# Patient Record
Sex: Male | Born: 2000 | Hispanic: Yes | Marital: Single | Smoking: Never smoker
Health system: Southern US, Community
[De-identification: ages and names within clinical notes are randomized; demographics above are authoritative.]

## PROBLEM LIST (undated history)

## (undated) ENCOUNTER — Emergency Department (HOSPITAL_COMMUNITY): Disposition: A | Discharge status: Home / Self Care | Payer: Self-pay

## (undated) DIAGNOSIS — N390 Urinary tract infection, site not specified: Secondary | ICD-10-CM

## (undated) DIAGNOSIS — Z8619 Personal history of other infectious and parasitic diseases: Secondary | ICD-10-CM

## (undated) DIAGNOSIS — Q625 Duplication of ureter: Secondary | ICD-10-CM

## (undated) DIAGNOSIS — N1370 Vesicoureteral-reflux, unspecified: Secondary | ICD-10-CM

## (undated) HISTORY — PX: BLADDER REPAIR: SHX76

## (undated) HISTORY — DX: Duplication of ureter: Q62.5

## (undated) HISTORY — DX: Urinary tract infection, site not specified: N39.0

## (undated) HISTORY — DX: Personal history of other infectious and parasitic diseases: Z86.19

## (undated) HISTORY — DX: Vesicoureteral-reflux, unspecified: N13.70

---
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
## 2021-09-16 ENCOUNTER — Other Ambulatory Visit: Payer: Self-pay

## 2021-09-16 ENCOUNTER — Ambulatory Visit (INDEPENDENT_AMBULATORY_CARE_PROVIDER_SITE_OTHER): Payer: 59 | Admitting: Physician Assistant

## 2021-09-16 VITALS — BP 100/66 | HR 69 | Temp 98.1°F | Ht 68.25 in | Wt 116.2 lb

## 2021-09-16 DIAGNOSIS — R0989 Other specified symptoms and signs involving the circulatory and respiratory systems: Secondary | ICD-10-CM

## 2021-09-16 DIAGNOSIS — Z13220 Encounter for screening for lipoid disorders: Secondary | ICD-10-CM | POA: Diagnosis not present

## 2021-09-16 DIAGNOSIS — Z1329 Encounter for screening for other suspected endocrine disorder: Secondary | ICD-10-CM

## 2021-09-16 DIAGNOSIS — Z131 Encounter for screening for diabetes mellitus: Secondary | ICD-10-CM | POA: Diagnosis not present

## 2021-09-16 LAB — CBC WITH DIFFERENTIAL/PLATELET
Basophils Absolute: 0 10*3/uL (ref 0.0–0.1)
Basophils Relative: 0.8 % (ref 0.0–3.0)
Eosinophils Absolute: 0.1 10*3/uL (ref 0.0–0.7)
Eosinophils Relative: 2.2 % (ref 0.0–5.0)
HCT: 43.2 % (ref 39.0–52.0)
Hemoglobin: 14.9 g/dL (ref 13.0–17.0)
Lymphocytes Relative: 39.2 % (ref 12.0–46.0)
Lymphs Abs: 1.6 10*3/uL (ref 0.7–4.0)
MCHC: 34.4 g/dL (ref 30.0–36.0)
MCV: 89 fl (ref 78.0–100.0)
Monocytes Absolute: 0.3 10*3/uL (ref 0.1–1.0)
Monocytes Relative: 6.4 % (ref 3.0–12.0)
Neutro Abs: 2.2 10*3/uL (ref 1.4–7.7)
Neutrophils Relative %: 51.4 % (ref 43.0–77.0)
Platelets: 263 10*3/uL (ref 150.0–400.0)
RBC: 4.86 Mil/uL (ref 4.22–5.81)
RDW: 13.1 % (ref 11.5–14.6)
WBC: 4.2 10*3/uL — ABNORMAL LOW (ref 4.5–10.5)

## 2021-09-16 LAB — THYROID PANEL WITH TSH
Free Thyroxine Index: 2.7 (ref 1.4–3.8)
T3 Uptake: 32 % (ref 22–35)
T4, Total: 8.3 ug/dL (ref 5.1–10.3)
TSH: 1.35 mIU/L (ref 0.40–4.50)

## 2021-09-16 NOTE — Progress Notes (Signed)
Subjective:    Patient ID: Isaiah Moore, male    DOB: Apr 07, 2001, 21 y.o.   MRN: [id number]  Chief Complaint  Patient presents with   Throat Issues    HPI 21 y.o. patient presents today for new patient establishment with me.  Patient was previously established with Dr. Luna Fuse. 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 Living with parents & one of his three sisters.   Current Care Team: No specialists    Acute Concerns: Feeling fullness in his throat for the last year or so. Only hurts when he applies some pressure. No difficulty swallowing. No personal or family issues of thyroid conditions. He finds it difficult to gain weight and feels like he is underweight for the amount of food he takes in. Usually naps daily, but wouldn't say he is overwhelmingly fatigued. Usually runs 1-2 miles a few times per week for exercise. Tried lifting weights at the gym but did not see much progress for muscle building. No other symptoms.    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 Off prophylaxis. Discharged from follow-up with Urology.    Urinary tract infection     Past Surgical History:  Procedure Laterality Date   BLADDER REPAIR     correction of vesicoureteral reflux Right 07/21/13    Family History  Problem Relation Age of Onset   Hypertension Maternal Grandmother    Allergies Mother    Healthy Father     Social History   Tobacco Use   Smoking status: Never   Smokeless tobacco: Never  Vaping Use   Vaping Use: Never used  Substance Use Topics   Alcohol use: No   Drug use: No     No Known Allergies  Review of Systems NEGATIVE UNLESS OTHERWISE INDICATED IN HPI      Objective:     BP 100/66    Pulse 69    Temp 98.1 F (36.7 C)    Ht 5' 8.25" (1.734 m)    Wt 116 lb 3.2 oz (52.7 kg)    SpO2 99%    BMI 17.54 kg/m   Wt  Readings from Last 3 Encounters:  09/16/21 116 lb 3.2 oz (52.7 kg)  03/04/18 101 lb 6.4 oz (46 kg) (<1 %, Z= -2.60)*  10/07/17 101 lb 3.2 oz (45.9 kg) (<1 %, Z= -2.40)*   * Growth percentiles are based on CDC (Boys, 2-20 Years) data.    BP Readings from Last 3 Encounters:  09/16/21 100/66  09/06/19 116/75  09/05/19 115/72     Physical Exam Vitals and nursing note reviewed.  Constitutional:      General: He is not in acute distress.    Appearance: Normal appearance. He is not toxic-appearing.     Comments: THIN, but not wasting   HENT:     Head: Normocephalic and atraumatic.     Right Ear: Tympanic membrane, ear canal and external ear normal.     Left Ear: Tympanic membrane, ear canal and external ear normal.     Nose: Nose normal.     Mouth/Throat:     Mouth: Mucous membranes are moist.     Pharynx: Oropharynx is clear.  Eyes:     Extraocular Movements: Extraocular movements intact.     Conjunctiva/sclera: Conjunctivae normal.     Pupils: Pupils are equal, round, and reactive to light.  Neck:  Thyroid: No thyroid mass, thyromegaly or thyroid tenderness.  Cardiovascular:     Rate and Rhythm: Normal rate and regular rhythm.     Pulses: Normal pulses.     Heart sounds: Normal heart sounds.  Pulmonary:     Effort: Pulmonary effort is normal.     Breath sounds: Normal breath sounds.  Musculoskeletal:        General: Normal range of motion.     Cervical back: Normal range of motion and neck supple.  Lymphadenopathy:     Cervical: No cervical adenopathy.  Skin:    General: Skin is warm and dry.  Neurological:     General: No focal deficit present.     Mental Status: He is alert and oriented to person, place, and time.  Psychiatric:        Mood and Affect: Mood normal.        Behavior: Behavior normal.       Assessment & Plan:   Problem List Items Addressed This Visit   None Visit Diagnoses     Throat fullness    -  Primary   Relevant Orders   CBC with  Differential/Platelet   Comprehensive metabolic panel   Thyroid Panel With TSH   Diabetes mellitus screening       Relevant Orders   Comprehensive metabolic panel   Screening for cholesterol level       Relevant Orders   Lipid panel   Thyroid disorder screen       Relevant Orders   Thyroid Panel With TSH       Plan: -New patient establishment. 21 yo overall healthy male having trouble gaining weight and feeling some fullness along his throat. Will check labs, esp need to r/o thyroid disorder. Pending normal labs, will need to meet with nutritionist as next best step.   Keyante Durio M Mc Hollen, PA-C

## 2021-09-16 NOTE — Patient Instructions (Addendum)
Good to meet you today! Please go to the lab for blood work and I will send results through MyChart. We will treat pending results. May need to consider seeing a nutritionist as well.

## 2021-09-17 LAB — COMPREHENSIVE METABOLIC PANEL
ALT: 10 U/L (ref 0–53)
AST: 15 U/L (ref 0–37)
Albumin: 4.8 g/dL (ref 3.5–5.2)
Alkaline Phosphatase: 59 U/L (ref 39–117)
BUN: 12 mg/dL (ref 6–23)
CO2: 26 mEq/L (ref 19–32)
Calcium: 9.3 mg/dL (ref 8.4–10.5)
Chloride: 104 mEq/L (ref 96–112)
Creatinine, Ser: 0.9 mg/dL (ref 0.40–1.50)
GFR: 122.62 mL/min (ref >60.00)
Glucose, Bld: 85 mg/dL (ref 70–99)
Potassium: 4 mEq/L (ref 3.5–5.1)
Sodium: 141 mEq/L (ref 135–145)
Total Bilirubin: 0.7 mg/dL (ref 0.2–1.2)
Total Protein: 7.9 g/dL (ref 6.0–8.3)

## 2021-09-17 LAB — LIPID PANEL
Cholesterol: 157 mg/dL (ref 0–200)
HDL: 51.3 mg/dL (ref >39.00)
LDL Cholesterol: 98 mg/dL (ref 0–99)
NonHDL: 106.08
Total CHOL/HDL Ratio: 3
Triglycerides: 40 mg/dL (ref 0.0–149.0)
VLDL: 8 mg/dL (ref 0.0–40.0)

## 2022-04-27 ENCOUNTER — Encounter: Payer: Self-pay | Admitting: *Deleted

## 2022-07-16 ENCOUNTER — Encounter: Payer: Self-pay | Admitting: *Deleted

## 2024-08-14 ENCOUNTER — Ambulatory Visit: Admitting: Physician Assistant

## 2024-08-15 ENCOUNTER — Encounter: Payer: Self-pay | Admitting: Physician Assistant

## 2024-08-15 ENCOUNTER — Ambulatory Visit (INDEPENDENT_AMBULATORY_CARE_PROVIDER_SITE_OTHER): Admitting: Physician Assistant

## 2024-08-15 VITALS — BP 108/68 | HR 73 | Temp 98.2°F | Ht 68.25 in | Wt 137.2 lb

## 2024-08-15 DIAGNOSIS — Z8619 Personal history of other infectious and parasitic diseases: Secondary | ICD-10-CM | POA: Diagnosis not present

## 2024-08-15 DIAGNOSIS — Z23 Encounter for immunization: Secondary | ICD-10-CM | POA: Diagnosis not present

## 2024-08-15 DIAGNOSIS — Z0000 Encounter for general adult medical examination without abnormal findings: Secondary | ICD-10-CM

## 2024-08-15 NOTE — Progress Notes (Signed)
 "   Patient ID: Isaiah Moore, male    DOB: Mar 27, 2001, 24 y.o.   MRN: [id number]   Assessment & Plan:  Encounter for annual physical exam  History of syphilis -     Ambulatory referral to Infectious Disease  Immunization due -     Flu vaccine trivalent PF, 6mos and older(Flulaval,Afluria,Fluarix,Fluzone)      Assessment and Plan Assessment & Plan Annual physical examination 24 year old male with no significant health changes since last visit in 2023. Weight gain of 20 pounds, BMI now 20.7. No medications. Mental health stable with some stress and sleep issues. No rapid weight loss, night sweats, chest pain, or shortness of breath. Physically active with regular gym attendance. No significant changes in vision or dental health. No smoking, vaping, or alcohol use. Nutrition improving with weight gain. No urinary or bowel changes. No significant moles. Inflamed lymph node from syphilis infection, slightly reduced in size. - Continue regular physical activity and healthy diet. - Encouraged dental check-up and vision assessment. - Scheduled follow-up in a couple of years for routine check-up.  History of syphilis Recent positive syphilis test on December 30th, 2025, with symptoms of weakness, headaches, and fatigue. Treated with two penicillin injections at the [facility]. Symptoms improved but some fatigue persists. No prior syphilis history. Partner tested nonreactive. Awaiting HIV test results. Inflamed lymph node present but slightly reduced in size. - Referred to infectious disease specialist for further evaluation and management. - Ensure follow-up testing for syphilis and HIV. - Monitor lymph node for changes.  General health maintenance Discussed importance of flu vaccination. He expressed dislike for needles but agreed to receive flu shot. - Administered flu shot.      No follow-ups on file.    Subjective:    Chief Complaint  Patient presents with    Annual Exam    Pt in office for annual CPE and labs; pt not fasting today; pt recently found out he had syphilis and health dept gave patient 2 penicillin injections for treatment, was advised to be retested;     HPI Discussed the use of AI scribe software for clinical note transcription with the patient, who gave verbal consent to proceed.  History of Present Illness Isaiah Moore is a 24 year old male who presents for an annual physical exam.  He recently had a positive syphilis test on August 01, 2024, at the [facility]. He experienced symptoms of weakness, headaches, and fatigue, which have improved but not completely resolved. He received two penicillin injections as treatment. His girlfriend was nonreactive for syphilis.  He has a history of inflamed lymph nodes related to the syphilis infection, which have not fully resolved. He also had rashes associated with the syphilis infection, which have resolved.  He has gained approximately 20 pounds since 2023, increasing his BMI from 17 to 20.7. 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 He struggles with eating full meals and often skips breakfast, but he tries to eat throughout the day.  No rapid weight loss, night sweats, chest pain, shortness of breath, urinary or bowel changes, or significant sleep issues. He experiences some sleep issues, primarily due to taking naps during the day, which affects his nighttime sleep.  He reports no current medication use and denies smoking, vaping, or alcohol use. He has not had any recent vision or dental check-ups, and he is unsure if his health insurance covers dental care.  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 Off prophylaxis. Discharged from follow-up with Urology.    Urinary tract infection     Past Surgical History:  Procedure Laterality Date   BLADDER REPAIR     correction of vesicoureteral reflux Right 07/21/13    Family History  Problem Relation Age of Onset   Hypertension Maternal Grandmother    Allergies Mother    Healthy Father     Social History[1]   Allergies[2]  Review of Systems NEGATIVE UNLESS OTHERWISE INDICATED IN HPI      Objective:     BP 108/68 (BP Location: Left Arm, Patient Position: Sitting, Cuff Size: Normal)   Pulse 73   Temp 98.2 F (36.8 C) (Temporal)   Ht 5' 8.25 (1.734 m)   Wt 137 lb 3.2 oz (62.2 kg)   SpO2 99%   BMI 20.71 kg/m   Wt Readings from Last 3 Encounters:  08/15/24 137 lb 3.2 oz (62.2 kg)  09/16/21 116 lb 3.2 oz (52.7 kg)  03/04/18 101 lb 6.4 oz (46 kg) (<1%, Z= -2.60)*   * Growth percentiles are based on CDC (Boys, 2-20 Years) data.    BP Readings from Last 3 Encounters:  08/15/24 108/68  09/16/21 100/66  09/06/19 116/75     Physical Exam Vitals and nursing note reviewed.  Constitutional:      General: He is not in acute distress.    Appearance: Normal appearance. He is not toxic-appearing.  HENT:     Head: Normocephalic and atraumatic.     Right Ear: Tympanic membrane, ear canal and external ear normal.     Left Ear: Tympanic membrane, ear canal and external ear normal.     Nose: Nose normal.     Mouth/Throat:     Mouth: Mucous membranes are moist.     Pharynx: Oropharynx is clear.  Eyes:     Extraocular Movements: Extraocular movements intact.     Conjunctiva/sclera: Conjunctivae normal.     Pupils: Pupils are equal, round, and reactive to light.  Cardiovascular:     Rate and Rhythm: Normal rate and regular rhythm.     Pulses: Normal pulses.     Heart sounds: Normal heart sounds.  Pulmonary:     Effort: Pulmonary effort is normal.     Breath sounds: Normal breath sounds.  Abdominal:      General: Abdomen is flat. Bowel sounds are normal.     Palpations: Abdomen is soft.     Tenderness: There is no abdominal tenderness.  Musculoskeletal:        General: Normal range of motion.     Cervical back: Normal range of motion and neck supple.  Lymphadenopathy:     Cervical: Cervical adenopathy present.     Left cervical: Superficial cervical adenopathy (mobile, non-tender, less than 3 mm) present.  Skin:    General: Skin is warm and dry.     Findings: No lesion or rash.  Neurological:     General: No focal deficit present.     Mental Status: He is alert and oriented to person, place, and time.  Psychiatric:        Mood and Affect: Mood normal.        Behavior: Behavior normal.  Alphonzo Devera M Gianfranco Araki, PA-C     [1]  Social History Tobacco Use   Smoking status: Never   Smokeless tobacco: Never  Vaping Use   Vaping status: Never Used  Substance Use Topics   Alcohol use: Not Currently    Comment: socially   Drug use: No  [2] No Known Allergies  "

## 2024-09-05 ENCOUNTER — Encounter: Admitting: Physician Assistant

## 2025-08-16 ENCOUNTER — Encounter: Admitting: Physician Assistant
